# Patient Record
Sex: Male | Born: 1955 | Hispanic: Yes | Marital: Single | Smoking: Never smoker
Health system: Southern US, Community
[De-identification: ages and names within clinical notes are randomized; demographics above are authoritative.]

## PROBLEM LIST (undated history)

## (undated) DIAGNOSIS — I10 Essential (primary) hypertension: Secondary | ICD-10-CM

---

## 2017-12-04 ENCOUNTER — Encounter (HOSPITAL_COMMUNITY): Payer: Self-pay | Admitting: *Deleted

## 2017-12-04 ENCOUNTER — Emergency Department (HOSPITAL_COMMUNITY): Payer: Self-pay

## 2017-12-04 ENCOUNTER — Emergency Department (HOSPITAL_COMMUNITY)
Admission: EM | Admit: 2017-12-04 | Discharge: 2017-12-04 | Disposition: A | Discharge status: Home / Self Care | Payer: Self-pay | Attending: Emergency Medicine | Admitting: Emergency Medicine

## 2017-12-04 ENCOUNTER — Other Ambulatory Visit: Payer: Self-pay

## 2017-12-04 DIAGNOSIS — N390 Urinary tract infection, site not specified: Secondary | ICD-10-CM | POA: Insufficient documentation

## 2017-12-04 DIAGNOSIS — K5900 Constipation, unspecified: Secondary | ICD-10-CM | POA: Insufficient documentation

## 2017-12-04 DIAGNOSIS — R1012 Left upper quadrant pain: Secondary | ICD-10-CM | POA: Insufficient documentation

## 2017-12-04 LAB — COMPREHENSIVE METABOLIC PANEL
ALBUMIN: 4.2 g/dL (ref 3.5–5.0)
ALT: 44 U/L (ref 0–44)
AST: 33 U/L (ref 15–41)
Alkaline Phosphatase: 119 U/L (ref 38–126)
Anion gap: 11 (ref 5–15)
BILIRUBIN TOTAL: 0.4 mg/dL (ref 0.3–1.2)
BUN: 26 mg/dL — AB (ref 8–23)
CHLORIDE: 107 mmol/L (ref 98–111)
CO2: 25 mmol/L (ref 22–32)
CREATININE: 1.1 mg/dL (ref 0.61–1.24)
Calcium: 9.5 mg/dL (ref 8.9–10.3)
GFR calc Af Amer: >60 mL/min (ref >60)
GFR calc non Af Amer: >60 mL/min (ref >60)
Glucose, Bld: 121 mg/dL — ABNORMAL HIGH (ref 70–99)
Potassium: 3.8 mmol/L (ref 3.5–5.1)
Sodium: 143 mmol/L (ref 135–145)
Total Protein: 7.6 g/dL (ref 6.5–8.1)

## 2017-12-04 LAB — TROPONIN I

## 2017-12-04 LAB — CBC WITH DIFFERENTIAL/PLATELET
BASOS PCT: 1 %
Basophils Absolute: 0.1 10*3/uL (ref 0.0–0.1)
Eosinophils Absolute: 0.3 10*3/uL (ref 0.0–0.5)
Eosinophils Relative: 3 %
HEMATOCRIT: 39.1 % (ref 39.0–52.0)
Hemoglobin: 13.5 g/dL (ref 13.0–17.0)
LYMPHS ABS: 2.2 10*3/uL (ref 0.7–4.0)
Lymphocytes Relative: 24 %
MCH: 29.2 pg (ref 26.0–34.0)
MCHC: 34.5 g/dL (ref 30.0–36.0)
MCV: 84.6 fL (ref 80.0–100.0)
MONO ABS: 0.6 10*3/uL (ref 0.1–1.0)
MONOS PCT: 7 %
NEUTROS ABS: 6 10*3/uL (ref 1.7–7.7)
Neutrophils Relative %: 65 %
Platelets: 259 10*3/uL (ref 150–400)
RBC: 4.62 MIL/uL (ref 4.22–5.81)
RDW: 13.6 % (ref 11.5–15.5)
WBC: 9.2 10*3/uL (ref 4.0–10.5)

## 2017-12-04 LAB — URINALYSIS, ROUTINE W REFLEX MICROSCOPIC
Bilirubin Urine: NEGATIVE
Glucose, UA: NEGATIVE mg/dL
Hgb urine dipstick: NEGATIVE
Ketones, ur: NEGATIVE mg/dL
Nitrite: POSITIVE — AB
Protein, ur: NEGATIVE mg/dL
Specific Gravity, Urine: 1.019 (ref 1.005–1.030)
pH: 6 (ref 5.0–8.0)

## 2017-12-04 LAB — LIPASE, BLOOD: Lipase: 41 U/L (ref 11–51)

## 2017-12-04 MED ORDER — CEPHALEXIN 500 MG PO CAPS: 500.0000 mg | ORAL_CAPSULE | Freq: Two times a day (BID) | ORAL | 0 refills | Status: AC

## 2017-12-04 MED ORDER — MAGNESIUM CITRATE PO SOLN
1.0000 | Freq: Once | ORAL | Status: AC
  Administered 2017-12-04: 1 via ORAL
  Filled 2017-12-04: qty 296

## 2017-12-04 NOTE — ED Triage Notes (Signed)
Pt reports he has not had a bowel movement in two days. He is having upper abdominal pain and nausea for 2 days. He took advil for the pain.

## 2017-12-04 NOTE — Discharge Instructions (Addendum)
Your lab work and other tests today came back normal.   Your abdominal pain is from your constipation. It is unclear why you have been constipated. There are several over-the-counter stool softeners and laxatives that you can use for relief. Laxatives help you have bowel movements immediately vs stool softeners which help the stool pass easier. It is safe to take stool softeners daily.   Your urine revealed that you have an urinary tract infection. This could also be contributing to your abdominal issues today. I have sent a prescription for antibiotics that you need to take. It is important that you take the full five (5) day course of the antibiotics, Keflex, to make sure the infection is treated completely.  It is VERY important that you re-establish care with a primary care provider. Given your family's history of colon cancer, it is also important that you follow-up with PCP so you can get a colonoscopy.  Thank you for allowing me to take care of you today!

## 2017-12-04 NOTE — ED Notes (Signed)
Gave pt cup ice water and urinal. Instructed to use call bell when can void.

## 2017-12-04 NOTE — ED Notes (Signed)
Pt aware urine sample needed, urinal at bedside.  

## 2017-12-04 NOTE — ED Provider Notes (Signed)
[facility]-EMERGENCY DEPT Provider Note  CSN: [id number] Arrival date & time: 12/04/17  4098  History   Chief Complaint Chief Complaint  Patient presents with  . Constipation    HPI Kurt Martin is a 62 y.o. male with no significant medical history who presented to the ED for constipation. He reports not having a bowel movement in 2 days. Denies any recent changes in diet or appetite. He has not tried any intervention for relief. He reports family history of colon cancer, but patient has not had a colonoscopy or other evaluation for this. Denies past GI issues. Associated symptom: abdominal pain.   The history is provided by the patient.  Abdominal Pain   This is a new problem. The current episode started 2 days ago. Episode frequency: intermittent. The problem has not changed since onset.The pain is associated with an unknown factor. The pain is located in the epigastric region (Has moved to RUQ and LUQ as well). The quality of the pain is dull and aching. The pain is at a severity of 4/10. The pain is mild. Associated symptoms include nausea and constipation. Pertinent negatives include fever, diarrhea, hematochezia, melena, vomiting, dysuria, frequency, hematuria, arthralgias and myalgias. The symptoms are aggravated by eating. The symptoms are relieved by NSAIDs and H2 blockers. Past workup comments: None. Past medical history comments: No GI, cardiac or surgical history.    History reviewed. No pertinent past medical history.  There are no active problems to display for this patient.    Home Medications    Prior to Admission medications   Not on File    Family History No family history on file.  Social History Social History   Tobacco Use  . Smoking status: Never Smoker  Substance Use Topics  . Alcohol use: Never    Frequency: Never  . Drug use: Never     Allergies   Patient has no known allergies.   Review of Systems Review of Systems    Constitutional: Negative for appetite change, chills, fatigue, fever and unexpected weight change.  HENT: Negative.   Eyes: Negative.   Respiratory: Positive for shortness of breath.        Upon arrival to the ED  Cardiovascular: Positive for chest pain.  Gastrointestinal: Positive for abdominal pain, constipation and nausea. Negative for diarrhea, hematochezia, melena and vomiting.  Genitourinary: Negative for dysuria, frequency and hematuria.  Musculoskeletal: Negative for arthralgias and myalgias.  Skin: Negative.   Neurological: Negative.   Hematological: Negative.    Physical Exam Updated Vital Signs BP (!) 166/102   Pulse 93   Temp 98.8 F (37.1 C) (Oral)   Resp 18   SpO2 99%   Physical Exam  Constitutional: He appears well-developed and well-nourished. He is cooperative. He does not appear ill. No distress.  HENT:  Mouth/Throat: Uvula is midline, oropharynx is clear and moist and mucous membranes are normal.  Cardiovascular: Normal rate, regular rhythm, normal heart sounds, intact distal pulses and normal pulses.  No murmur heard. Pulmonary/Chest: Effort normal and breath sounds normal.  Abdominal: Soft. Normal appearance and bowel sounds are normal. There is no tenderness. There is no rigidity and no guarding.  Musculoskeletal: Normal range of motion.  Neurological: He is alert. He has normal strength. No sensory deficit.  Skin: Skin is warm. Capillary refill takes less than 2 seconds. No rash noted.  Nursing note and vitals reviewed.  ED Treatments / Results  Labs (all labs ordered are listed, but only abnormal results  are displayed) Labs Reviewed  COMPREHENSIVE METABOLIC PANEL - Abnormal; Notable for the following components:      Result Value   Glucose, Bld 121 (*)    BUN 26 (*)    All other components within normal limits  CBC WITH DIFFERENTIAL/PLATELET  LIPASE, BLOOD  URINALYSIS, ROUTINE W REFLEX MICROSCOPIC  TROPONIN I    EKG EKG  Interpretation  Date/Time:  Tuesday December 04 2017 06:49:55 EDT Ventricular Rate:  84 PR Interval:    QRS Duration: 88 QT Interval:  386 QTC Calculation: 457 R Axis:   29 Text Interpretation:  Sinus rhythm LVH by voltage Borderline T abnormalities, inferior leads No old tracing to compare Confirmed by Dione Booze ([id number]) on 12/04/2017 6:52:36 AM Also confirmed by Dione Booze ([id number]), editor Barbette Hair [phone])  on 12/04/2017 7:10:54 AM   Radiology No results found.  Procedures Procedures (including critical care time)  Medications Ordered in ED Medications - No data to display   Initial Impression / Assessment and Plan / ED Course  Triage vital signs and the nursing notes have been reviewed.  Pertinent labs & imaging results that were available during care of the patient were reviewed and considered in medical decision making (see chart for details).  Patient presents to the ED with 2 day history of upper abdominal complaints with constipation. History is nonspecific and there are no red flags for abdominal pain in his history. Denies tobacco or EtOH use. Has family history of colon cancer which patient has not received a colonoscopy for evaluation, but he does not have any s/s to suggest malignancy. He also endorsed chest pain and feeling SOB before coming to the ED, but states that those symptoms have resolved as well. Given pt's age and lack of medical follow-up, will proceed with GI and cardiac work-up to further evaluate his complaints.  Clinical Course as of Dec 04 848  Tue Dec 04, 2017  9147 EKG showed NSR. No ST elevations/depressions or signs of acute ischemia or infarct. This is reassuring in combination with negative troponin which assists in evaluating and ruling out an acute cardiac process. CXR normal. Useful in ruling out cardiac or pulm etiology to complaints. Blood work is unremarkable which assists in ruling out infectious or metabolic etiology to complaints.  Combined with normal physical exam, acute intra-abdominal processes such as appendicitis, pancreatitis, peritonitis or perforation is ruled out as well. No indication to do abdominal imaging.   [GM]  0801 Elevated BP upon arrival and throughout visit. No s/s of end organ damage. No past documentation of HTN, but pt's last medical visit was 02/2014 and reports not going to medical provider since then. Advised to follow-up with PCP to re-establish care for primary medical concerns.   [GM]  0840 UA indicative of UTI. Will treat as outpatient with Keflex. Urine culture sent.   [GM]    Clinical Course User Index [GM] Michelle Vanhise, Sharyon Medicus, PA-C    Final Clinical Impressions(s) / ED Diagnoses  1. Constipation. Education on OTC and supportive treatment for relief given. Patient requested laxative prior to discharge. Magnesium citrate solution given. Advised to establish care with PCP to discuss colonoscopy and BP. 2. UTI. Rx for Keflex 500mg  BID x5 days prescribed. Urine culture sent and patient will be contact if change in therapy is needed.  Dispo: Home. After thorough clinical evaluation, this patient is determined to be medically stable and can be safely discharged with the previously mentioned treatment and/or outpatient follow-up/referral(s). At this time, there are  no other apparent medical conditions that require further screening, evaluation or treatment.   Final diagnoses:  Constipation, unspecified constipation type  Urinary tract infection without hematuria, site unspecified    ED Discharge Orders         Ordered    cephALEXin (KEFLEX) 500 MG capsule  2 times daily     12/04/17 0841            Daviyon Widmayer, [place], PA-C 12/04/17 0850    Dione Booze, MD 12/09/17 2231

## 2017-12-06 LAB — URINE CULTURE: Culture: >=100000 — AB

## 2017-12-07 ENCOUNTER — Telehealth: Payer: Self-pay | Admitting: *Deleted

## 2017-12-07 NOTE — Progress Notes (Signed)
ED Antimicrobial Stewardship Positive Culture Follow Up   Kurt Martin is an 62 y.o. male who presented to [facility] on 12/04/2017 with a chief complaint of constipation and abdominal pain. Denied urinary symptoms (dysuria, frequency, hematuria), and fever. WBC wnl and patient afebrile.   Chief Complaint  Patient presents with  . Constipation    Recent Results (from the past 720 hour(s))  Urine culture     Status: Abnormal   Collection Time: 12/04/17  8:16 AM  Result Value Ref Range Status   Specimen Description   Final    URINE, CLEAN CATCH Performed at [facility], [address], [place], [place] [address]    Special Requests   Final    NONE Performed at [facility], [address] [place], [place] [address]    Culture >=100,000 COLONIES/mL ESCHERICHIA COLI (A)  Final   Report Status 12/06/2017 FINAL  Final   Organism ID, Bacteria ESCHERICHIA COLI (A)  Final      Susceptibility   Escherichia coli - MIC*    AMPICILLIN 4 SENSITIVE Sensitive     CEFAZOLIN <=4 SENSITIVE Sensitive     CEFTRIAXONE <=1 SENSITIVE Sensitive     CIPROFLOXACIN <=0.25 SENSITIVE Sensitive     GENTAMICIN <=1 SENSITIVE Sensitive     IMIPENEM <=0.25 SENSITIVE Sensitive     NITROFURANTOIN <=16 SENSITIVE Sensitive     TRIMETH/SULFA <=20 SENSITIVE Sensitive     AMPICILLIN/SULBACTAM <=2 SENSITIVE Sensitive     PIP/TAZO <=4 SENSITIVE Sensitive     Extended ESBL NEGATIVE Sensitive     * >=100,000 COLONIES/mL ESCHERICHIA COLI   Based on urinalysis, patient was discharged home on cephalexin for UTI. However, since patient was asymptomatic, this should be considered asymptomatic bacteriuria.   After discussing with ED provider today, the recommendation to stop antibiotics was declined and the new plan is to continue the course of antibiotics.  ED Provider: Harlene Salts, PA-C  Lawerance Bach 12/07/2017, 9:45 AM Clinical Pharmacist Monday - Friday phone -   [phone] Saturday - Sunday phone - [phone]

## 2017-12-07 NOTE — Telephone Encounter (Signed)
Post ED Visit - Positive Culture Follow-up  Culture report reviewed by antimicrobial stewardship pharmacist:  []  Enzo Bi, Pharm.D. []  Celedonio Miyamoto, Pharm.D., BCPS AQ-ID []  Garvin Fila, Pharm.D., BCPS []  Georgina Pillion, Pharm.D., BCPS []  [place], [address].D., BCPS, AAHIVP []  Estella Husk, Pharm.D., BCPS, AAHIVP []  Lysle Pearl, PharmD, BCPS []  Phillips Climes, PharmD, BCPS []  Agapito Games, PharmD, BCPS []  Verlan Friends, PharmD Danae Orleans, PharmD  Positive urine culture Treated with Cephalexin, organism sensitive to the same and no further patient follow-up is required at this time.  Virl Axe [facility] 12/07/2017, 11:50 AM

## 2018-01-03 ENCOUNTER — Ambulatory Visit (INDEPENDENT_AMBULATORY_CARE_PROVIDER_SITE_OTHER): Payer: Self-pay | Admitting: Physician Assistant

## 2018-01-03 ENCOUNTER — Encounter (INDEPENDENT_AMBULATORY_CARE_PROVIDER_SITE_OTHER): Payer: Self-pay | Admitting: Physician Assistant

## 2018-01-03 VITALS — BP 167/91 | HR 86 | Temp 97.9°F | Resp 18 | Ht 64.0 in | Wt 152.0 lb

## 2018-01-03 DIAGNOSIS — F411 Generalized anxiety disorder: Secondary | ICD-10-CM

## 2018-01-03 DIAGNOSIS — G5601 Carpal tunnel syndrome, right upper limb: Secondary | ICD-10-CM

## 2018-01-03 DIAGNOSIS — Z23 Encounter for immunization: Secondary | ICD-10-CM

## 2018-01-03 DIAGNOSIS — Z114 Encounter for screening for human immunodeficiency virus [HIV]: Secondary | ICD-10-CM

## 2018-01-03 DIAGNOSIS — R12 Heartburn: Secondary | ICD-10-CM

## 2018-01-03 DIAGNOSIS — I10 Essential (primary) hypertension: Secondary | ICD-10-CM

## 2018-01-03 DIAGNOSIS — Z1211 Encounter for screening for malignant neoplasm of colon: Secondary | ICD-10-CM

## 2018-01-03 DIAGNOSIS — Z1159 Encounter for screening for other viral diseases: Secondary | ICD-10-CM

## 2018-01-03 MED ORDER — HYDROCHLOROTHIAZIDE 25 MG PO TABS: 25.0000 mg | ORAL_TABLET | Freq: Every day | ORAL | 1 refills | Status: AC

## 2018-01-03 MED ORDER — ESCITALOPRAM OXALATE 10 MG PO TABS: 10.0000 mg | ORAL_TABLET | Freq: Every day | ORAL | 2 refills | Status: AC

## 2018-01-03 MED ORDER — HYDROXYZINE HCL 25 MG PO TABS: 25.0000 mg | ORAL_TABLET | Freq: Three times a day (TID) | ORAL | 0 refills | Status: AC | PRN

## 2018-01-03 MED ORDER — OMEPRAZOLE 40 MG PO CPDR: 40.0000 mg | DELAYED_RELEASE_CAPSULE | Freq: Every day | ORAL | 3 refills | Status: AC

## 2018-01-03 MED ORDER — LOSARTAN POTASSIUM 50 MG PO TABS: 50.0000 mg | ORAL_TABLET | Freq: Every day | ORAL | 3 refills | Status: AC

## 2018-01-03 NOTE — Patient Instructions (Signed)

## 2018-01-03 NOTE — Progress Notes (Signed)
Subjective:  Patient ID: Kurt Martin, male    DOB: Dec 29, 1955  Age: 62 y.o. MRN: [id number]  CC: hospital discharge f/u  HPI Kurt Martin is a 62 y.o. male with no significant medical history presents as a new patient on hospital discharge follow up.  Went to ED nearly one month ago with complaint of constipation. Had been two days without a bowel movement. Was prescribed Magnesium Citrate which helped clear his constipation. He was also diagnosed with a UTI. Culture grew E coli sensitive to all tested antibiotics. Pt prescribed Keflex but took only 5 days because he was told to take only five days. Does not endorse any urinary complaints. Only complaints are of heartburn and of right hand tingling. Pt requests antacid which has been helpful in resolving heartburn. 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    BP was noted to be elevated at the ED. Pt was told to inquire about HTN management with PCP. Pt states he has had elevated blood pressures for as long as he can remember. Attributes elevated BP to his "hyperactivity". Pt also endorses being a nervous person. Does not endorse any cardiovascular symptoms.      Outpatient Medications Prior to Visit  Medication Sig Dispense Refill  . ibuprofen (ADVIL,MOTRIN) 200 MG tablet Take 200 mg by mouth daily as needed for mild pain.     No facility-administered medications prior to visit.      ROS Review of Systems  Constitutional: Negative for chills, fever and malaise/fatigue.  Eyes: Negative for blurred vision.  Respiratory: Negative for shortness of breath.   Cardiovascular: Negative for chest pain and palpitations.  Gastrointestinal: Negative for abdominal pain and nausea.  Genitourinary: Negative for dysuria and hematuria.  Musculoskeletal: Negative for joint pain and myalgias.  Skin: Negative for rash.  Neurological: Negative for tingling and headaches.  Psychiatric/Behavioral: Negative  for depression. The patient is nervous/anxious.     Objective:   Vitals:   01/03/18 0917  BP: (!) 167/91  Pulse: 86  Resp: 18  Temp: 97.9 F (36.6 C)  TempSrc: Oral  SpO2: 98%  Weight: 152 lb (68.9 kg)  Height: 5\' 4"  (1.626 m)      Physical Exam  Constitutional: He is oriented to person, place, and time.  Well developed, well nourished, NAD, polite  HENT:  Head: Normocephalic and atraumatic.  Eyes: No scleral icterus.  Neck: Normal range of motion. Neck supple. No thyromegaly present.  Cardiovascular: Normal rate, regular rhythm and normal heart sounds.  Pulmonary/Chest: Effort normal and breath sounds normal.  Musculoskeletal: He exhibits no edema.  Neurological: He is alert and oriented to person, place, and time.  Skin: Skin is warm and dry. No rash noted. No erythema. No pallor.  Sweaty and clammy palms  Psychiatric: His behavior is normal. Thought content normal.  Seems anxious, fidgety  Vitals reviewed.    Assessment & Plan:   1. Hypertension, unspecified type - TSH - Begin hydrochlorothiazide (HYDRODIURIL) 25 MG tablet; Take 1 tablet (25 mg total) by mouth daily. Take on tablet in the morning.  Dispense: 90 tablet; Refill: 1 - Begin losartan (COZAAR) 50 MG tablet; Take 1 tablet (50 mg total) by mouth daily.  Dispense: 90 tablet; Refill: 3  2. Heartburn - Begin omeprazole (PRILOSEC) 40 MG capsule; Take 1 capsule (40 mg total) by mouth daily.  Dispense: 30 capsule; Refill: 3  3. Generalized anxiety disorder - TSH - escitalopram (LEXAPRO) 10 MG tablet; Take 1  tablet (10 mg total) by mouth daily.  Dispense: 30 tablet; Refill: 2 - hydrOXYzine (ATARAX/VISTARIL) 25 MG tablet; Take 1 tablet (25 mg total) by mouth 3 (three) times daily as needed.  Dispense: 30 tablet; Refill: 0  4. Carpal tunnel syndrome of right wrist - Pt advised to continue wearing his wrist splint and to avoid strong gripping or   5. Screening for HIV (human immunodeficiency virus) - HIV  Antibody (routine testing w rflx)  6. Screening for colon cancer - Fecal occult blood, imunochemical  7. Need for hepatitis C screening test - Hepatitis c antibody (reflex)  8. Need for Tdap vaccination - Tdap vaccine greater than or equal to 7yo IM   Meds ordered this encounter  Medications  . hydrochlorothiazide (HYDRODIURIL) 25 MG tablet    Sig: Take 1 tablet (25 mg total) by mouth daily. Take on tablet in the morning.    Dispense:  90 tablet    Refill:  1    Order Specific Question:   Supervising Provider    Answer:   Hoy Register [[id number]]  . losartan (COZAAR) 50 MG tablet    Sig: Take 1 tablet (50 mg total) by mouth daily.    Dispense:  90 tablet    Refill:  3    Order Specific Question:   Supervising Provider    Answer:   Hoy Register [[id number]]  . omeprazole (PRILOSEC) 40 MG capsule    Sig: Take 1 capsule (40 mg total) by mouth daily.    Dispense:  30 capsule    Refill:  3    Order Specific Question:   Supervising Provider    Answer:   Hoy Register [[id number]]  . escitalopram (LEXAPRO) 10 MG tablet    Sig: Take 1 tablet (10 mg total) by mouth daily.    Dispense:  30 tablet    Refill:  2    Order Specific Question:   Supervising Provider    Answer:   Hoy Register [[id number]]  . hydrOXYzine (ATARAX/VISTARIL) 25 MG tablet    Sig: Take 1 tablet (25 mg total) by mouth 3 (three) times daily as needed.    Dispense:  30 tablet    Refill:  0    Order Specific Question:   Supervising Provider    Answer:   Hoy Register [[id number]]    Follow-up: Return in about 4 weeks (around 01/31/2018) for anxiety.   Loletta Specter PA

## 2018-01-04 ENCOUNTER — Telehealth (INDEPENDENT_AMBULATORY_CARE_PROVIDER_SITE_OTHER): Payer: Self-pay

## 2018-01-04 LAB — HEPATITIS C ANTIBODY (REFLEX)

## 2018-01-04 LAB — HIV ANTIBODY (ROUTINE TESTING W REFLEX): HIV Screen 4th Generation wRfx: NONREACTIVE

## 2018-01-04 LAB — TSH: TSH: 2.7 u[IU]/mL (ref 0.450–4.500)

## 2018-01-04 LAB — HCV COMMENT:

## 2018-01-04 NOTE — Telephone Encounter (Signed)
-----   Message from Roger David Gomez, PA-C sent at 01/04/2018 12:48 PM EST ----- Normal thyroid. Negative HIV and HCV. 

## 2018-01-04 NOTE — Telephone Encounter (Addendum)
Call placed to patient using pacific interpreter Doree Fudge [phone]) patient voicemail not setup yet. Will attempt to call patient once more. If patient returns all please inform that normal thyroid; negative HIV and Hep C. Maryjean Morn, CMA

## 2018-01-07 ENCOUNTER — Encounter (INDEPENDENT_AMBULATORY_CARE_PROVIDER_SITE_OTHER): Payer: Self-pay

## 2018-01-07 ENCOUNTER — Telehealth (INDEPENDENT_AMBULATORY_CARE_PROVIDER_SITE_OTHER): Payer: Self-pay

## 2018-01-07 NOTE — Telephone Encounter (Signed)
Call placed using pacific interpreter [phone]) patient voicemail not setup yet. Results mailed to patient. Maryjean Morn, CMA

## 2018-01-07 NOTE — Telephone Encounter (Signed)
-----   Message from Loletta Specter, PA-C sent at 01/04/2018 12:48 PM EST ----- Normal thyroid. Negative HIV and HCV.

## 2018-01-31 ENCOUNTER — Other Ambulatory Visit: Payer: Self-pay

## 2018-01-31 ENCOUNTER — Ambulatory Visit (INDEPENDENT_AMBULATORY_CARE_PROVIDER_SITE_OTHER): Payer: Self-pay | Admitting: Physician Assistant

## 2018-01-31 ENCOUNTER — Encounter (INDEPENDENT_AMBULATORY_CARE_PROVIDER_SITE_OTHER): Payer: Self-pay | Admitting: Physician Assistant

## 2018-01-31 VITALS — BP 155/92 | HR 73 | Temp 98.1°F | Ht 64.0 in | Wt 151.4 lb

## 2018-01-31 DIAGNOSIS — I10 Essential (primary) hypertension: Secondary | ICD-10-CM

## 2018-01-31 DIAGNOSIS — R12 Heartburn: Secondary | ICD-10-CM

## 2018-01-31 DIAGNOSIS — F411 Generalized anxiety disorder: Secondary | ICD-10-CM

## 2018-01-31 DIAGNOSIS — Z131 Encounter for screening for diabetes mellitus: Secondary | ICD-10-CM

## 2018-01-31 LAB — POCT GLYCOSYLATED HEMOGLOBIN (HGB A1C): Hemoglobin A1C: 5.5 % (ref 4.0–5.6)

## 2018-01-31 NOTE — Progress Notes (Signed)
Subjective:  Patient ID: Kurt Martin, male    DOB: 11-28-1955  Age: 63 y.o. MRN: [id number]  CC:   HPI Kurt Martin is a 62 y.o. male with a PMH of HTN presents to f/u on HTN. Last seen here one month ago with a BP of 167/91 mmHg. Prescribed HCTZ 25 mg and Losartan 50 mg. TSH normal. BP 155/92 mmHg in clinic today. Says he has not taken his anti-hypertensive medications yet this morning since he has not eaten. Also states he is not taking one of his other  medications since it caused diarrhea. Does not know the name of the medication. Reports resolution of heartburn with omeprazole. In regards to anxiety, last GAD7 score was one but patient seemed clinically anxious and was prescribed Lexapro. It is unclear if he is taking Lexapro but GAD7 score is zero today. PHQ9 score was 1 and now 0.       Outpatient Medications Prior to Visit  Medication Sig Dispense Refill  . escitalopram (LEXAPRO) 10 MG tablet Take 1 tablet (10 mg total) by mouth daily. 30 tablet 2  . hydrochlorothiazide (HYDRODIURIL) 25 MG tablet Take 1 tablet (25 mg total) by mouth daily. Take on tablet in the morning. 90 tablet 1  . hydrOXYzine (ATARAX/VISTARIL) 25 MG tablet Take 1 tablet (25 mg total) by mouth 3 (three) times daily as needed. 30 tablet 0  . losartan (COZAAR) 50 MG tablet Take 1 tablet (50 mg total) by mouth daily. 90 tablet 3  . omeprazole (PRILOSEC) 40 MG capsule Take 1 capsule (40 mg total) by mouth daily. 30 capsule 3   No facility-administered medications prior to visit.      ROS Review of Systems  Constitutional: Negative for chills, fever and malaise/fatigue.  Eyes: Negative for blurred vision.  Respiratory: Negative for shortness of breath.   Cardiovascular: Negative for chest pain and palpitations.  Gastrointestinal: Negative for abdominal pain and nausea.  Genitourinary: Negative for dysuria and hematuria.  Musculoskeletal: Negative for joint pain and myalgias.  Skin: Negative for rash.   Neurological: Negative for tingling and headaches.  Psychiatric/Behavioral: Negative for depression. The patient is not nervous/anxious.     Objective:  BP (!) 155/92 (BP Location: Right Arm, Patient Position: Sitting, Cuff Size: Normal)   Pulse 73   Temp 98.1 F (36.7 C) (Oral)   Ht 5\' 4"  (1.626 m)   Wt 151 lb 6.4 oz (68.7 kg)   SpO2 97%   BMI 25.99 kg/m   BP/Weight 01/31/2018 01/03/2018 12/04/2017  Systolic BP 155 167 178  Diastolic BP 92 91 100  Wt. (Lbs) 151.4 152 -  BMI 25.99 26.09 -      Physical Exam  Constitutional: He is oriented to person, place, and time.  Well developed, well nourished, NAD, polite  HENT:  Head: Normocephalic and atraumatic.  Eyes: No scleral icterus.  Neck: Normal range of motion. Neck supple. No thyromegaly present.  Cardiovascular: Normal rate, regular rhythm and normal heart sounds.  Pulmonary/Chest: Effort normal and breath sounds normal.  Abdominal: Soft. Bowel sounds are normal. There is no tenderness.  Musculoskeletal: He exhibits no edema.  Neurological: He is alert and oriented to person, place, and time.  Skin: Skin is warm and dry. No rash noted. No erythema. No pallor.  Psychiatric: He has a normal mood and affect. His behavior is normal. Thought content normal.  Somewhat hyperactive in mannerisms and speech  Vitals reviewed.    Assessment & Plan:   1. Hypertension, unspecified type -  Better today but still elevated. Has not taken his anti-hypertensive medications yet this morning. Pt advised to take medications before he sees his provider again.   2. Screening for diabetes mellitus - HgB A1c 5.5% today.  3. Generalized anxiety disorder - Unclear if he is taking Lexapro. Pt does not remember names of medications. Says he does not consider himself an anxious person. Pt advised to take Lexapro 10 mg as directed.  4. Heartburn - Resolved with omeprazole  Follow-up: Return in about 6 weeks (around 03/14/2018) for BP check .    Loletta Specteroger David Mcdonald Reiling PA

## 2018-01-31 NOTE — Patient Instructions (Signed)
DASH Eating Plan DASH stands for "Dietary Approaches to Stop Hypertension." The DASH eating plan is a healthy eating plan that has been shown to reduce high blood pressure (hypertension). It may also reduce your risk for type 2 diabetes, heart disease, and stroke. The DASH eating plan may also help with weight loss. What are tips for following this plan? General guidelines  Avoid eating more than 2,300 mg (milligrams) of salt (sodium) a day. If you have hypertension, you may need to reduce your sodium intake to 1,500 mg a day.  Limit alcohol intake to no more than 1 drink a day for nonpregnant women and 2 drinks a day for men. One drink equals 12 oz of beer, 5 oz of wine, or 1 oz of hard liquor.  Work with your health care provider to maintain a healthy body weight or to lose weight. Ask what an ideal weight is for you.  Get at least 30 minutes of exercise that causes your heart to beat faster (aerobic exercise) most days of the week. Activities may include walking, swimming, or biking.  Work with your health care provider or diet and nutrition specialist (dietitian) to adjust your eating plan to your individual calorie needs. Reading food labels  Check food labels for the amount of sodium per serving. Choose foods with less than 5 percent of the Daily Value of sodium. Generally, foods with less than 300 mg of sodium per serving fit into this eating plan.  To find whole grains, look for the word "whole" as the first word in the ingredient list. Shopping  Buy products labeled as "low-sodium" or "no salt added."  Buy fresh foods. Avoid canned foods and premade or frozen meals. Cooking  Avoid adding salt when cooking. Use salt-free seasonings or herbs instead of table salt or sea salt. Check with your health care provider or pharmacist before using salt substitutes.  Do not fry foods. Cook foods using healthy methods such as baking, boiling, grilling, and broiling instead.  Cook with  heart-healthy oils, such as olive, canola, soybean, or sunflower oil. Meal planning   Eat a balanced diet that includes: ? 5 or more servings of fruits and vegetables each day. At each meal, try to fill half of your plate with fruits and vegetables. ? Up to 6-8 servings of whole grains each day. ? Less than 6 oz of lean meat, poultry, or fish each day. A 3-oz serving of meat is about the same size as a deck of cards. One egg equals 1 oz. ? 2 servings of low-fat dairy each day. ? A serving of nuts, seeds, or beans 5 times each week. ? Heart-healthy fats. Healthy fats called Omega-3 fatty acids are found in foods such as flaxseeds and coldwater fish, like sardines, salmon, and mackerel.  Limit how much you eat of the following: ? Canned or prepackaged foods. ? Food that is high in trans fat, such as fried foods. ? Food that is high in saturated fat, such as fatty meat. ? Sweets, desserts, sugary drinks, and other foods with added sugar. ? Full-fat dairy products.  Do not salt foods before eating.  Try to eat at least 2 vegetarian meals each week.  Eat more home-cooked food and less restaurant, buffet, and fast food.  When eating at a restaurant, ask that your food be prepared with less salt or no salt, if possible. What foods are recommended? The items listed may not be a complete list. Talk with your dietitian about what   dietary choices are best for you. Grains Whole-grain or whole-wheat bread. Whole-grain or whole-wheat pasta. Brown rice. Oatmeal. Quinoa. Bulgur. Whole-grain and low-sodium cereals. Pita bread. Low-fat, low-sodium crackers. Whole-wheat flour tortillas. Vegetables Fresh or frozen vegetables (raw, steamed, roasted, or grilled). Low-sodium or reduced-sodium tomato and vegetable juice. Low-sodium or reduced-sodium tomato sauce and tomato paste. Low-sodium or reduced-sodium canned vegetables. Fruits All fresh, dried, or frozen fruit. Canned fruit in natural juice (without  added sugar). Meat and other protein foods Skinless chicken or turkey. Ground chicken or turkey. Pork with fat trimmed off. Fish and seafood. Egg whites. Dried beans, peas, or lentils. Unsalted nuts, nut butters, and seeds. Unsalted canned beans. Lean cuts of beef with fat trimmed off. Low-sodium, lean deli meat. Dairy Low-fat (1%) or fat-free (skim) milk. Fat-free, low-fat, or reduced-fat cheeses. Nonfat, low-sodium ricotta or cottage cheese. Low-fat or nonfat yogurt. Low-fat, low-sodium cheese. Fats and oils Soft margarine without trans fats. Vegetable oil. Low-fat, reduced-fat, or light mayonnaise and salad dressings (reduced-sodium). Canola, safflower, olive, soybean, and sunflower oils. Avocado. Seasoning and other foods Herbs. Spices. Seasoning mixes without salt. Unsalted popcorn and pretzels. Fat-free sweets. What foods are not recommended? The items listed may not be a complete list. Talk with your dietitian about what dietary choices are best for you. Grains Baked goods made with fat, such as croissants, muffins, or some breads. Dry pasta or rice meal packs. Vegetables Creamed or fried vegetables. Vegetables in a cheese sauce. Regular canned vegetables (not low-sodium or reduced-sodium). Regular canned tomato sauce and paste (not low-sodium or reduced-sodium). Regular tomato and vegetable juice (not low-sodium or reduced-sodium). Pickles. Olives. Fruits Canned fruit in a light or heavy syrup. Fried fruit. Fruit in cream or butter sauce. Meat and other protein foods Fatty cuts of meat. Ribs. Fried meat. Bacon. Sausage. Bologna and other processed lunch meats. Salami. Fatback. Hotdogs. Bratwurst. Salted nuts and seeds. Canned beans with added salt. Canned or smoked fish. Whole eggs or egg yolks. Chicken or turkey with skin. Dairy Whole or 2% milk, cream, and half-and-half. Whole or full-fat cream cheese. Whole-fat or sweetened yogurt. Full-fat cheese. Nondairy creamers. Whipped toppings.  Processed cheese and cheese spreads. Fats and oils Butter. Stick margarine. Lard. Shortening. Ghee. Bacon fat. Tropical oils, such as coconut, palm kernel, or palm oil. Seasoning and other foods Salted popcorn and pretzels. Onion salt, garlic salt, seasoned salt, table salt, and sea salt. Worcestershire sauce. Tartar sauce. Barbecue sauce. Teriyaki sauce. Soy sauce, including reduced-sodium. Steak sauce. Canned and packaged gravies. Fish sauce. Oyster sauce. Cocktail sauce. Horseradish that you find on the shelf. Ketchup. Mustard. Meat flavorings and tenderizers. Bouillon cubes. Hot sauce and Tabasco sauce. Premade or packaged marinades. Premade or packaged taco seasonings. Relishes. Regular salad dressings. Where to find more information:  National Heart, Lung, and Blood Institute: www.nhlbi.nih.gov  American Heart Association: www.heart.org Summary  The DASH eating plan is a healthy eating plan that has been shown to reduce high blood pressure (hypertension). It may also reduce your risk for type 2 diabetes, heart disease, and stroke.  With the DASH eating plan, you should limit salt (sodium) intake to 2,300 mg a day. If you have hypertension, you may need to reduce your sodium intake to 1,500 mg a day.  When on the DASH eating plan, aim to eat more fresh fruits and vegetables, whole grains, lean proteins, low-fat dairy, and heart-healthy fats.  Work with your health care provider or diet and nutrition specialist (dietitian) to adjust your eating plan to your individual   calorie needs. This information is not intended to replace advice given to you by your health care provider. Make sure you discuss any questions you have with your health care provider. Document Released: 02/02/2011 Document Revised: 02/07/2016 Document Reviewed: 02/07/2016 Elsevier Interactive Patient Education  2018 [employer]  

## 2018-03-14 ENCOUNTER — Ambulatory Visit (INDEPENDENT_AMBULATORY_CARE_PROVIDER_SITE_OTHER): Payer: Self-pay

## 2018-08-14 ENCOUNTER — Other Ambulatory Visit: Payer: Self-pay

## 2018-08-14 ENCOUNTER — Emergency Department (HOSPITAL_COMMUNITY)
Admission: EM | Admit: 2018-08-14 | Discharge: 2018-08-14 | Disposition: A | Discharge status: Home / Self Care | Payer: Self-pay | Attending: Emergency Medicine | Admitting: Emergency Medicine

## 2018-08-14 ENCOUNTER — Emergency Department (HOSPITAL_COMMUNITY): Payer: Self-pay

## 2018-08-14 ENCOUNTER — Encounter (HOSPITAL_COMMUNITY): Payer: Self-pay

## 2018-08-14 DIAGNOSIS — I10 Essential (primary) hypertension: Secondary | ICD-10-CM | POA: Insufficient documentation

## 2018-08-14 DIAGNOSIS — Z79899 Other long term (current) drug therapy: Secondary | ICD-10-CM | POA: Insufficient documentation

## 2018-08-14 DIAGNOSIS — R197 Diarrhea, unspecified: Secondary | ICD-10-CM | POA: Insufficient documentation

## 2018-08-14 DIAGNOSIS — R1012 Left upper quadrant pain: Secondary | ICD-10-CM | POA: Insufficient documentation

## 2018-08-14 DIAGNOSIS — R1011 Right upper quadrant pain: Secondary | ICD-10-CM

## 2018-08-14 DIAGNOSIS — R112 Nausea with vomiting, unspecified: Secondary | ICD-10-CM | POA: Insufficient documentation

## 2018-08-14 HISTORY — DX: Essential (primary) hypertension: I10

## 2018-08-14 LAB — URINALYSIS, ROUTINE W REFLEX MICROSCOPIC
Bilirubin Urine: NEGATIVE
Glucose, UA: NEGATIVE mg/dL
Hgb urine dipstick: NEGATIVE
Ketones, ur: NEGATIVE mg/dL
Leukocytes,Ua: NEGATIVE
Nitrite: NEGATIVE
Protein, ur: NEGATIVE mg/dL
Specific Gravity, Urine: 1.023 (ref 1.005–1.030)
pH: 5 (ref 5.0–8.0)

## 2018-08-14 LAB — COMPREHENSIVE METABOLIC PANEL
ALT: 26 U/L (ref 0–44)
AST: 27 U/L (ref 15–41)
Albumin: 4.7 g/dL (ref 3.5–5.0)
Alkaline Phosphatase: 96 U/L (ref 38–126)
Anion gap: 11 (ref 5–15)
BUN: 13 mg/dL (ref 8–23)
CO2: 23 mmol/L (ref 22–32)
Calcium: 8.6 mg/dL — ABNORMAL LOW (ref 8.9–10.3)
Chloride: 107 mmol/L (ref 98–111)
Creatinine, Ser: 1.02 mg/dL (ref 0.61–1.24)
GFR calc Af Amer: >60 mL/min (ref >60)
GFR calc non Af Amer: >60 mL/min (ref >60)
Glucose, Bld: 129 mg/dL — ABNORMAL HIGH (ref 70–99)
Potassium: 4 mmol/L (ref 3.5–5.1)
Sodium: 141 mmol/L (ref 135–145)
Total Bilirubin: 1.1 mg/dL (ref 0.3–1.2)
Total Protein: 8 g/dL (ref 6.5–8.1)

## 2018-08-14 LAB — CBC WITH DIFFERENTIAL/PLATELET
Abs Immature Granulocytes: 0.06 10*3/uL (ref 0.00–0.07)
Basophils Absolute: 0.1 10*3/uL (ref 0.0–0.1)
Basophils Relative: 1 %
Eosinophils Absolute: 0.1 10*3/uL (ref 0.0–0.5)
Eosinophils Relative: 1 %
HCT: 40.4 % (ref 39.0–52.0)
Hemoglobin: 13.4 g/dL (ref 13.0–17.0)
Immature Granulocytes: 1 %
Lymphocytes Relative: 21 %
Lymphs Abs: 1.9 10*3/uL (ref 0.7–4.0)
MCH: 28.3 pg (ref 26.0–34.0)
MCHC: 33.2 g/dL (ref 30.0–36.0)
MCV: 85.2 fL (ref 80.0–100.0)
Monocytes Absolute: 0.6 10*3/uL (ref 0.1–1.0)
Monocytes Relative: 6 %
Neutro Abs: 6.5 10*3/uL (ref 1.7–7.7)
Neutrophils Relative %: 70 %
Platelets: 240 10*3/uL (ref 150–400)
RBC: 4.74 MIL/uL (ref 4.22–5.81)
RDW: 13.2 % (ref 11.5–15.5)
WBC: 9.2 10*3/uL (ref 4.0–10.5)
nRBC: 0 % (ref 0.0–0.2)

## 2018-08-14 LAB — TROPONIN I: Troponin I: <0.03 ng/mL (ref <0.03)

## 2018-08-14 MED ORDER — IOHEXOL 300 MG/ML  SOLN
100.0000 mL | Freq: Once | INTRAMUSCULAR | Status: AC | PRN
  Administered 2018-08-14: 100 mL via INTRAVENOUS

## 2018-08-14 MED ORDER — FENTANYL CITRATE (PF) 100 MCG/2ML IJ SOLN
50.0000 ug | Freq: Once | INTRAMUSCULAR | Status: AC
  Administered 2018-08-14: 02:00:00 50 ug via INTRAVENOUS
  Filled 2018-08-14: qty 2

## 2018-08-14 MED ORDER — KETOROLAC TROMETHAMINE 30 MG/ML IJ SOLN
15.0000 mg | Freq: Once | INTRAMUSCULAR | Status: AC
  Administered 2018-08-14: 15 mg via INTRAVENOUS
  Filled 2018-08-14: qty 1

## 2018-08-14 MED ORDER — OMEPRAZOLE 20 MG PO CPDR: 20.0000 mg | DELAYED_RELEASE_CAPSULE | Freq: Every day | ORAL | 0 refills | Status: AC

## 2018-08-14 MED ORDER — HYDRALAZINE HCL 20 MG/ML IJ SOLN
2.0000 mg | Freq: Once | INTRAMUSCULAR | Status: AC
  Administered 2018-08-14: 2 mg via INTRAVENOUS
  Filled 2018-08-14: qty 1

## 2018-08-14 MED ORDER — ALUM & MAG HYDROXIDE-SIMETH 200-200-20 MG/5ML PO SUSP
30.0000 mL | Freq: Once | ORAL | Status: AC
  Administered 2018-08-14: 30 mL via ORAL
  Filled 2018-08-14: qty 30

## 2018-08-14 MED ORDER — DICYCLOMINE HCL 20 MG PO TABS: 20.0000 mg | ORAL_TABLET | Freq: Two times a day (BID) | ORAL | 0 refills | Status: AC

## 2018-08-14 MED ORDER — SODIUM CHLORIDE (PF) 0.9 % IJ SOLN
INTRAMUSCULAR | Status: AC
  Filled 2018-08-14: qty 50

## 2018-08-14 MED ORDER — LIDOCAINE VISCOUS HCL 2 % MT SOLN
15.0000 mL | Freq: Once | OROMUCOSAL | Status: AC
  Administered 2018-08-14: 01:00:00 15 mL via ORAL
  Filled 2018-08-14: qty 15

## 2018-08-14 MED ORDER — ONDANSETRON HCL 4 MG/2ML IJ SOLN
4.0000 mg | Freq: Once | INTRAMUSCULAR | Status: DC
  Filled 2018-08-14: qty 2

## 2018-08-14 MED ORDER — ONDANSETRON 8 MG PO TBDP: ORAL_TABLET | ORAL | 0 refills | Status: AC

## 2018-08-14 MED ORDER — DICYCLOMINE HCL 10 MG/ML IM SOLN
20.0000 mg | Freq: Once | INTRAMUSCULAR | Status: AC
  Administered 2018-08-14: 20 mg via INTRAMUSCULAR
  Filled 2018-08-14: qty 2

## 2018-08-14 NOTE — ED Notes (Signed)
Pt verbalized discharge instructions and follow up care. Alert and ambulatory. No IV.  

## 2018-08-14 NOTE — ED Notes (Signed)
Patient transported to CT 

## 2018-08-14 NOTE — ED Notes (Signed)
Urine and culture sent to lab  

## 2018-08-14 NOTE — ED Triage Notes (Signed)
Pt arrives with complaints of LUQ abdominal pain over the last three days. Pt has vomited 3 times in the last 24 hours. Pt reports some diarrhea but not today.

## 2018-08-14 NOTE — ED Provider Notes (Signed)
[facility]-EMERGENCY DEPT Provider Note   CSN: [id number] Arrival date & time: 08/14/18  0046     History   Chief Complaint Chief Complaint  Patient presents with  . Abdominal Pain    HPI Kurt Martin is a 63 y.o. male.     The history is provided by the patient.  Abdominal Pain Pain location:  LUQ and L flank Pain quality: cramping   Pain radiates to:  Chest Pain severity:  Severe Onset quality:  Gradual Duration:  2 days Timing:  Constant Progression:  Unchanged Chronicity:  Recurrent Context: not diet changes, not eating, not medication withdrawal, not sick contacts, not suspicious food intake and not trauma   Relieved by:  Nothing Worsened by:  Nothing Ineffective treatments:  NSAIDs Associated symptoms: diarrhea, nausea and vomiting   Associated symptoms: no cough, no dysuria, no fever and no shortness of breath   Risk factors: no alcohol abuse   Patient with GERD and HTN not on meds presents with left sided abdominal pain.  He has had this off and on in the past but usually resolves on its own but not this time and he had nausea and vomiting and loose stool this past am.  No f/c/r.  No cough, no SOB.  No exertional symptoms.    Past Medical History:  Diagnosis Date  . Hypertension     There are no active problems to display for this patient.   History reviewed. No pertinent surgical history.      Home Medications    Prior to Admission medications   Medication Sig Start Date End Date Taking? Authorizing Provider  hydrochlorothiazide (HYDRODIURIL) 25 MG tablet Take 1 tablet (25 mg total) by mouth daily. Take on tablet in the morning. 01/03/18  Yes Loletta SpecterGomez, Roger David, PA-C  losartan (COZAAR) 50 MG tablet Take 1 tablet (50 mg total) by mouth daily. 01/03/18  Yes Loletta SpecterGomez, Roger David, PA-C  omeprazole (PRILOSEC) 40 MG capsule Take 1 capsule (40 mg total) by mouth daily. 01/03/18  Yes Loletta SpecterGomez, Roger David, PA-C  escitalopram (LEXAPRO) 10 MG  tablet Take 1 tablet (10 mg total) by mouth daily. Patient not taking: Reported on 08/14/2018 01/03/18   Loletta SpecterGomez, Roger David, PA-C  hydrOXYzine (ATARAX/VISTARIL) 25 MG tablet Take 1 tablet (25 mg total) by mouth 3 (three) times daily as needed. Patient not taking: Reported on 08/14/2018 01/03/18   Loletta SpecterGomez, Roger David, PA-C    Family History No family history on file.  Social History Social History   Tobacco Use  . Smoking status: Never Smoker  . Smokeless tobacco: Never Used  Substance Use Topics  . Alcohol use: Never    Frequency: Never  . Drug use: Never     Allergies   Patient has no known allergies.   Review of Systems Review of Systems  Constitutional: Negative for fever.  Respiratory: Negative for cough and shortness of breath.   Cardiovascular: Negative for palpitations and leg swelling.  Gastrointestinal: Positive for abdominal pain, diarrhea, nausea and vomiting. Negative for blood in stool.  Genitourinary: Negative for difficulty urinating and dysuria.  All other systems reviewed and are negative.    Physical Exam Updated Vital Signs BP (!) 173/133 (BP Location: Left Arm)   Pulse 93   Temp 98.7 F (37.1 C) (Oral)   Resp 16   Ht 5\' 5"  (1.651 m)   SpO2 100%   BMI 25.19 kg/m   Physical Exam Vitals signs and nursing note reviewed.  Constitutional:  General: He is not in acute distress.    Appearance: He is normal weight.  HENT:     Head: Normocephalic and atraumatic.     Nose: Nose normal.     Mouth/Throat:     Mouth: Mucous membranes are moist.     Pharynx: Oropharynx is clear.  Eyes:     Conjunctiva/sclera: Conjunctivae normal.     Pupils: Pupils are equal, round, and reactive to light.  Neck:     Musculoskeletal: Normal range of motion and neck supple.  Cardiovascular:     Rate and Rhythm: Normal rate and regular rhythm.     Pulses: Normal pulses.     Heart sounds: Normal heart sounds.  Pulmonary:     Effort: Pulmonary effort is normal.      Breath sounds: Normal breath sounds.  Abdominal:     General: Abdomen is flat. Bowel sounds are normal.     Palpations: There is no mass.     Tenderness: There is no abdominal tenderness. There is no guarding or rebound.     Hernia: No hernia is present.  Musculoskeletal: Normal range of motion.  Skin:    General: Skin is warm and dry.     Capillary Refill: Capillary refill takes less than 2 seconds.  Neurological:     General: No focal deficit present.     Mental Status: He is alert and oriented to person, place, and time.  Psychiatric:        Mood and Affect: Mood normal.        Behavior: Behavior normal.      ED Treatments / Results  Labs (all labs ordered are listed, but only abnormal results are displayed) Labs Reviewed  CBC WITH DIFFERENTIAL/PLATELET  COMPREHENSIVE METABOLIC PANEL  TROPONIN I  URINALYSIS, ROUTINE W REFLEX MICROSCOPIC    EKG EKG Interpretation  Date/Time:  Wednesday August 14 2018 01:11:14 EDT Ventricular Rate:  76 PR Interval:    QRS Duration: 85 QT Interval:  415 QTC Calculation: 467 R Axis:   38 Text Interpretation:  Sinus rhythm Confirmed by Dory Horn) on 08/14/2018 1:17:17 AM   Radiology Dg Chest Portable 1 View  Result Date: 08/14/2018 CLINICAL DATA:  Chest pain. Nausea. EXAM: PORTABLE CHEST 1 VIEW COMPARISON:  12/04/2017 FINDINGS: Low lung volumes. Bronchial thickening again seen, likely not significantly changed. Normal heart size and mediastinal contours for low lung volumes. No confluent airspace disease, pleural effusion or pneumothorax. Degenerative change of the right shoulder. IMPRESSION: Low lung volumes. Bronchial thickening is unchanged from prior exam. Electronically Signed   By: Keith Rake M.D.   On: 08/14/2018 01:23    Procedures Procedures (including critical care time)  Medications Ordered in ED Medications  sodium chloride (PF) 0.9 % injection (has no administration in time range)  ondansetron (ZOFRAN)  injection 4 mg (4 mg Intravenous Not Given 08/14/18 0350)  alum & mag hydroxide-simeth (MAALOX/MYLANTA) 200-200-20 MG/5ML suspension 30 mL (30 mLs Oral Given 08/14/18 0119)    And  lidocaine (XYLOCAINE) 2 % viscous mouth solution 15 mL (15 mLs Oral Given 08/14/18 0119)  fentaNYL (SUBLIMAZE) injection 50 mcg (50 mcg Intravenous Given 08/14/18 0138)  hydrALAZINE (APRESOLINE) injection 2 mg (2 mg Intravenous Given 08/14/18 0140)  iohexol (OMNIPAQUE) 300 MG/ML solution 100 mL (100 mLs Intravenous Contrast Given 08/14/18 0332)  dicyclomine (BENTYL) injection 20 mg (20 mg Intramuscular Given 08/14/18 0410)  ketorolac (TORADOL) 30 MG/ML injection 15 mg (15 mg Intravenous Given 08/14/18 0409)     Final  Clinical Impressions(s) / ED Diagnoses   Return for intractable cough, coughing up blood,fevers >100.4 unrelieved by medication, shortness of breath, intractable vomiting, chest pain, shortness of breath, weakness,numbness, changes in speech, facial asymmetry,abdominal pain, passing out,Inability to tolerate liquids or food, cough, altered mental status or any concerns. No signs of systemic illness or infection. The patient is nontoxic-appearing on exam and vital signs are within normal limits.   I have reviewed the triage vital signs and the nursing notes. Pertinent labs &imaging results that were available during my care of the patient were reviewed by me and considered in my medical decision making (see chart for details).  After history, exam, and medical workup I feel the patient has been appropriately medically screened and is safe for discharge home. Pertinent diagnoses were discussed with the patient. Patient was given return precautions    Karn Derk, MD 08/14/18 [id number]

## 2020-07-24 IMAGING — CT CT ABDOMEN AND PELVIS WITH CONTRAST
2 of 5 series · 16 of 46 positions shown, 18 images · IV contrast (omnipaque)
Comparison: None.

CLINICAL DATA: 62 y/o M; 3 days of left upper quadrant abdominal
pain, nausea, vomiting, diarrhea.

EXAM:
CT ABDOMEN AND PELVIS WITH CONTRAST
TECHNIQUE: Multidetector CT imaging of the abdomen and pelvis was performed
using the standard protocol following bolus administration of
intravenous contrast.
CONTRAST:  100mL OMNIPAQUE IOHEXOL 300 MG/ML  SOLN

[Series 2: axial st · axial · 0.68mm/px · z∈[-246,+114]mm · 13 of 84 slices shown, 15 images]
[im 6/84  soft-tissue]
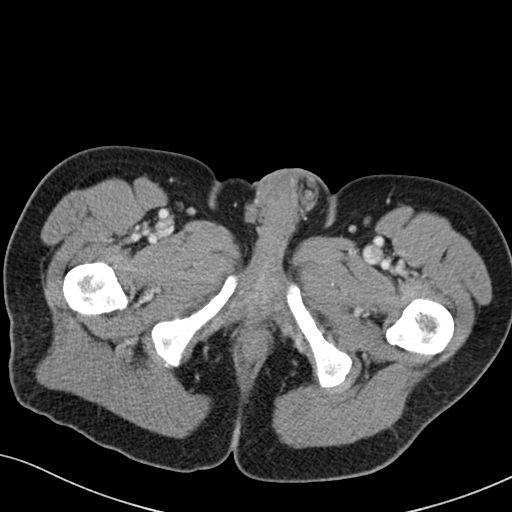
[im 6/84  bone]
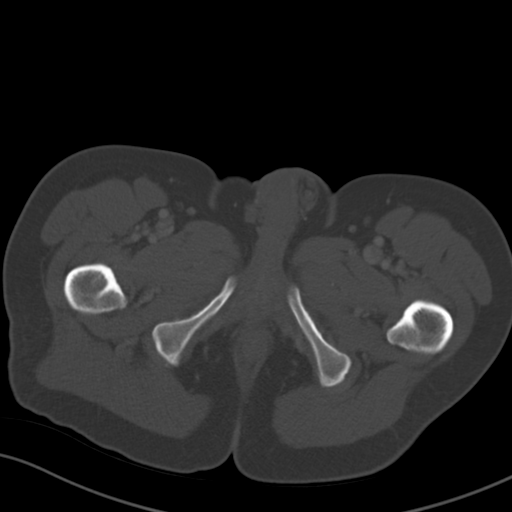
[im 11/84  soft-tissue]
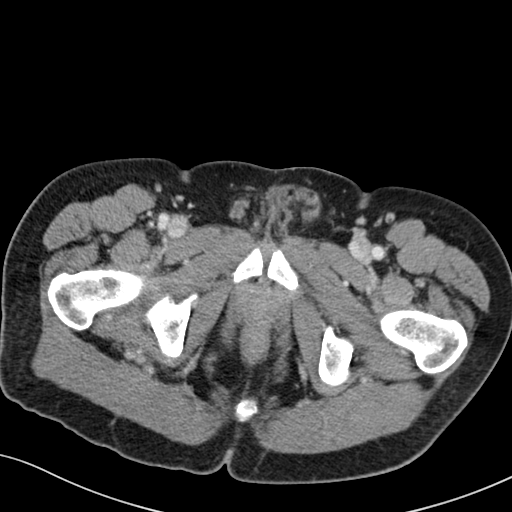
[im 16/84  soft-tissue]
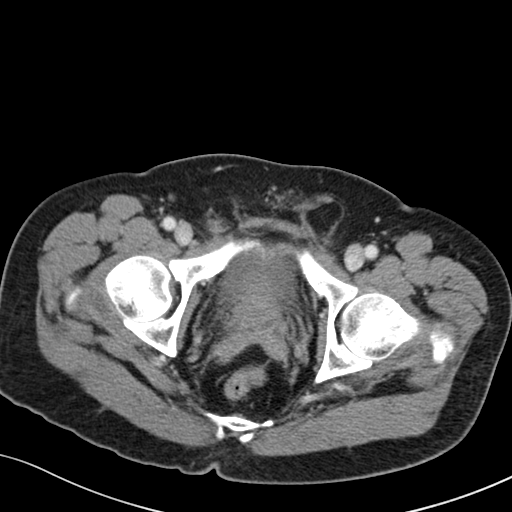
[im 26/84  soft-tissue]
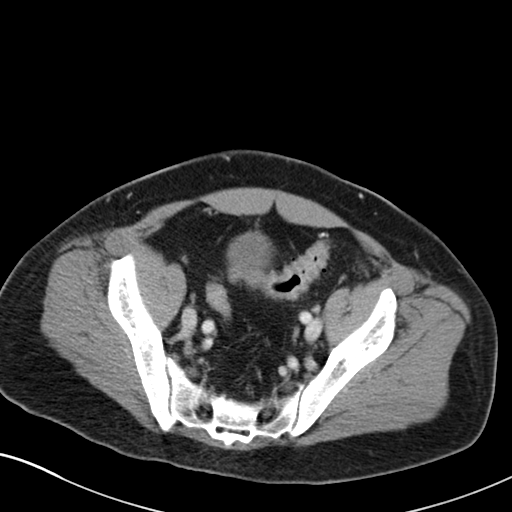
[im 32/84  soft-tissue]
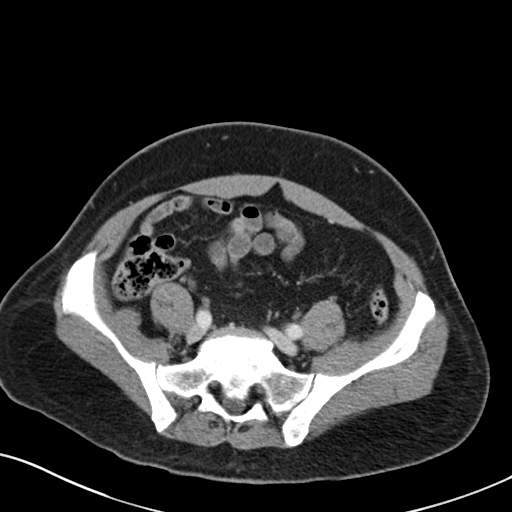
[im 37/84  soft-tissue]
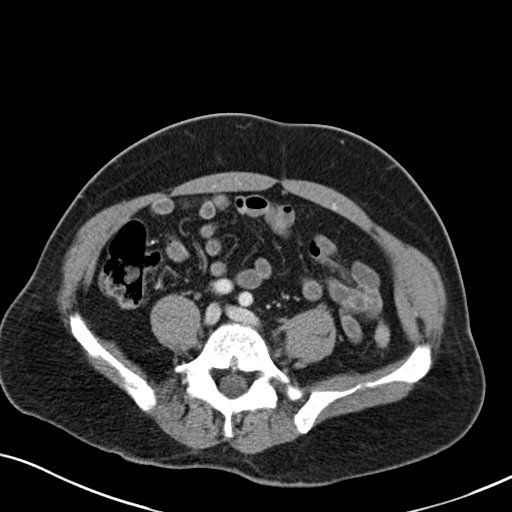
[im 42/84  soft-tissue]
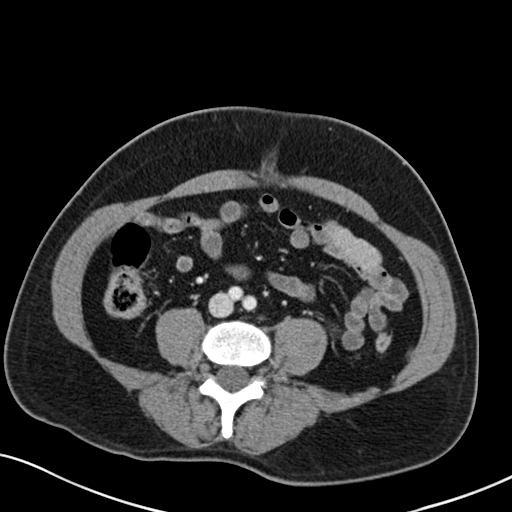
[im 47/84  soft-tissue]
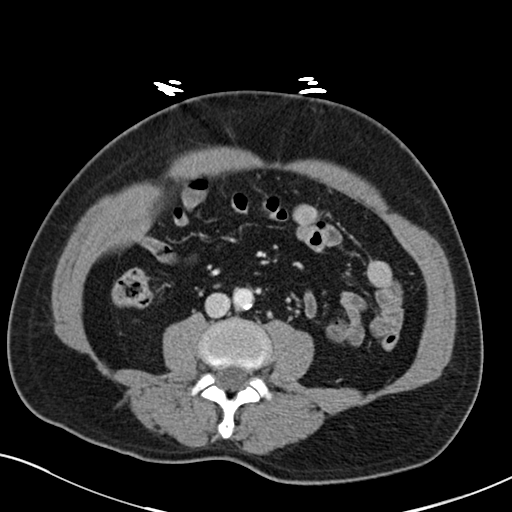
[im 52/84  soft-tissue]
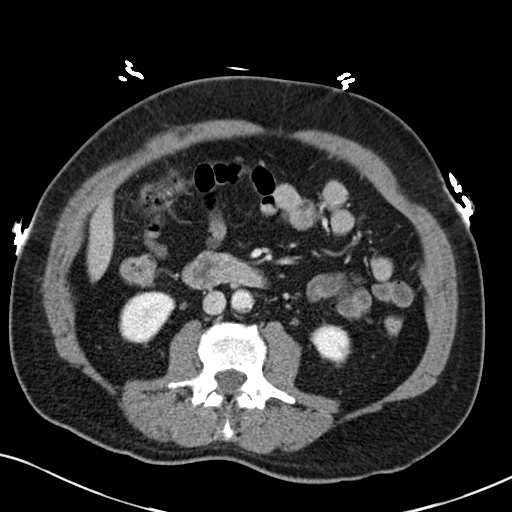
[im 52/84  bone]
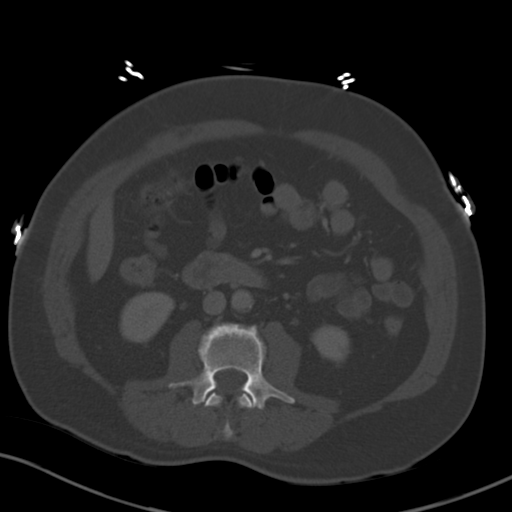
[im 58/84  soft-tissue]
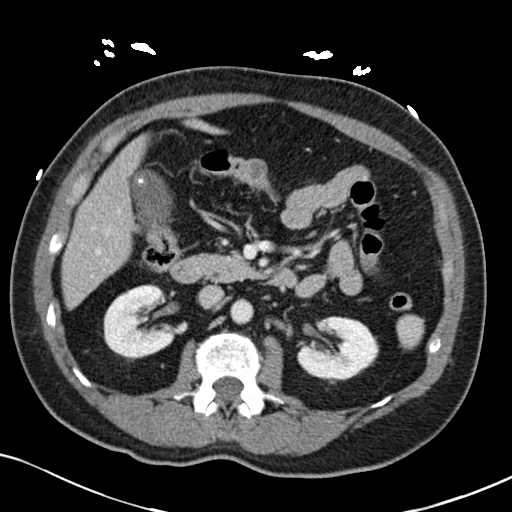
[im 68/84  soft-tissue]
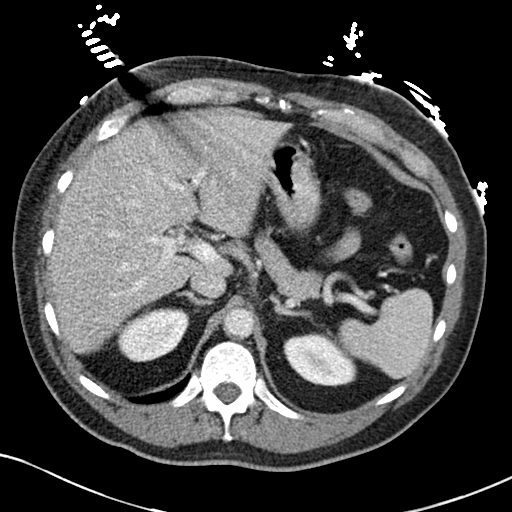
[im 73/84  soft-tissue]
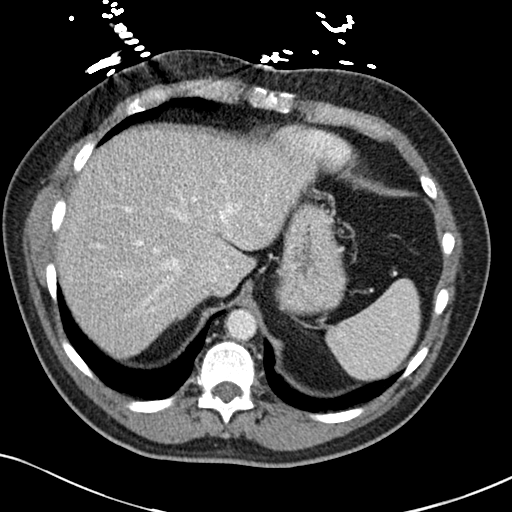
[im 78/84  soft-tissue]
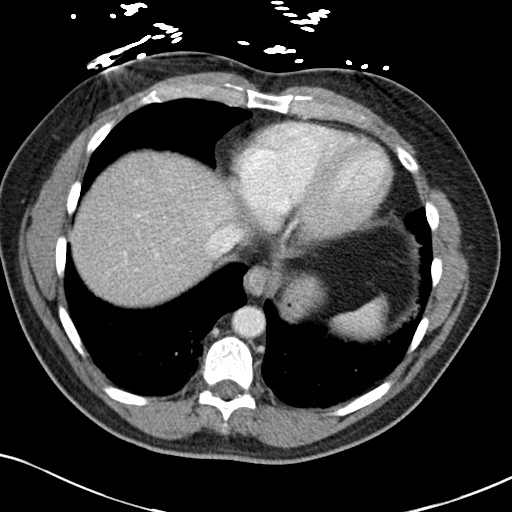

[Series 5: coronal st · coronal · 0.62mm/px · 3 of 136 slices shown]
[im 46/136  soft-tissue]
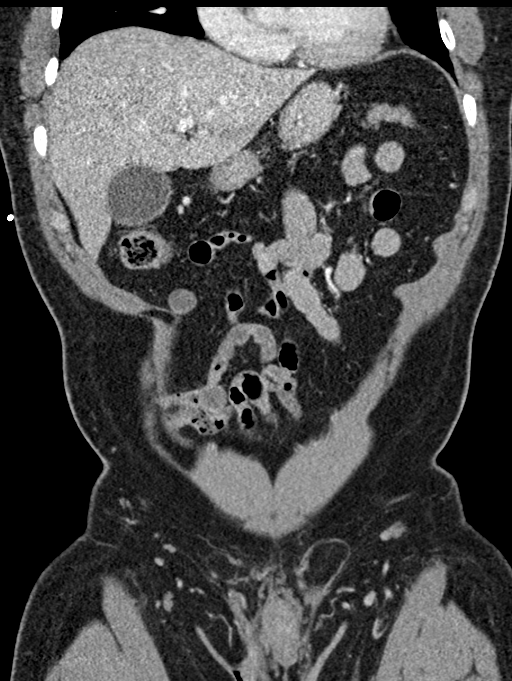
[im 61/136  soft-tissue]
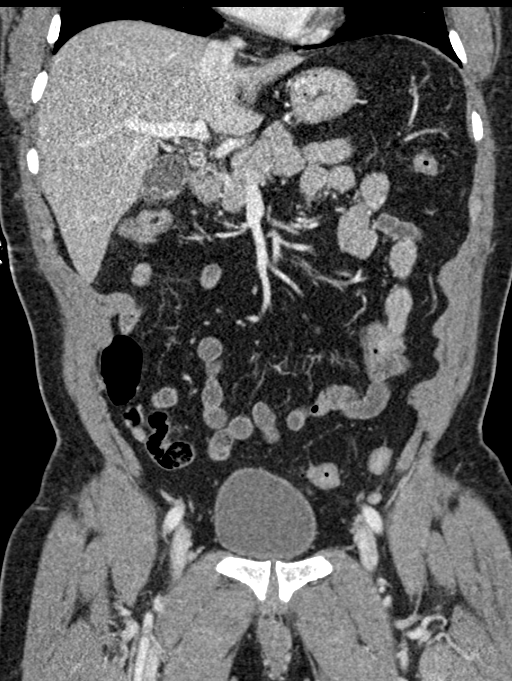
[im 76/136  soft-tissue]
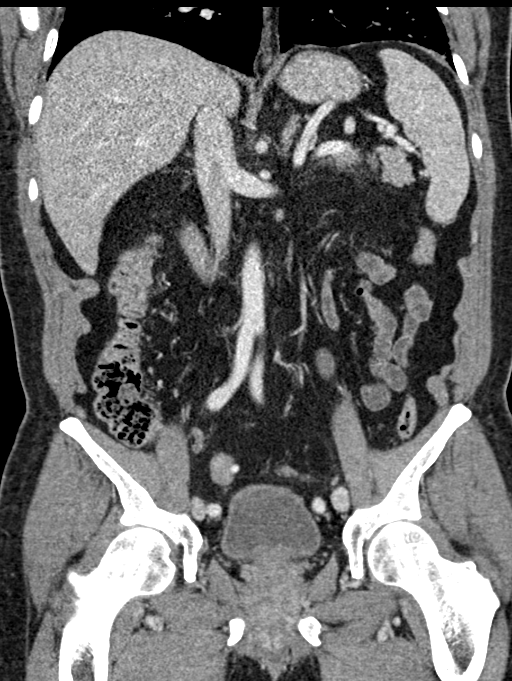

[16 of 46 positions shown; findings below may reference images not displayed]

FINDINGS: Lower chest: No acute abnormality.

Hepatobiliary: No focal liver abnormality is seen. Cholelithiasis
including a stone in the gallbladder neck. No gallbladder wall
thickening or biliary dilatation.

Pancreas: Unremarkable. No pancreatic ductal dilatation or
surrounding inflammatory changes.

Spleen: Normal in size without focal abnormality.

Adrenals/Urinary Tract: Adrenal glands are unremarkable. Kidneys are
normal, without renal calculi, focal lesion, or hydronephrosis.
Bladder is unremarkable.

Stomach/Bowel: Stomach is within normal limits. Appendix appears
normal. No evidence of bowel wall thickening, distention, or
inflammatory changes. Mild sigmoid diverticulosis.

Vascular/Lymphatic: Aortic atherosclerosis. No enlarged abdominal or
pelvic lymph nodes.

Reproductive: Mild prostate enlargement and central calcifications.

Other: Small bilateral inguinal hernias containing fat.

Musculoskeletal: No fracture is seen. Lumbar spondylosis greatest at
L5-S1 with there is moderate loss of intervertebral disc space
height.
IMPRESSION: 1. No acute process identified as explanation for pain.
2. Cholelithiasis.
3. Mild sigmoid diverticulosis.
4. Aortic Atherosclerosis ([id number]).

## 2020-07-24 IMAGING — DX PORTABLE CHEST - 1 VIEW
1 series · 1 of 1 positions shown · non-contrast
Comparison: 12/04/2017

CLINICAL DATA: Chest pain. Nausea.

EXAM:
PORTABLE CHEST 1 VIEW

[chest ap]
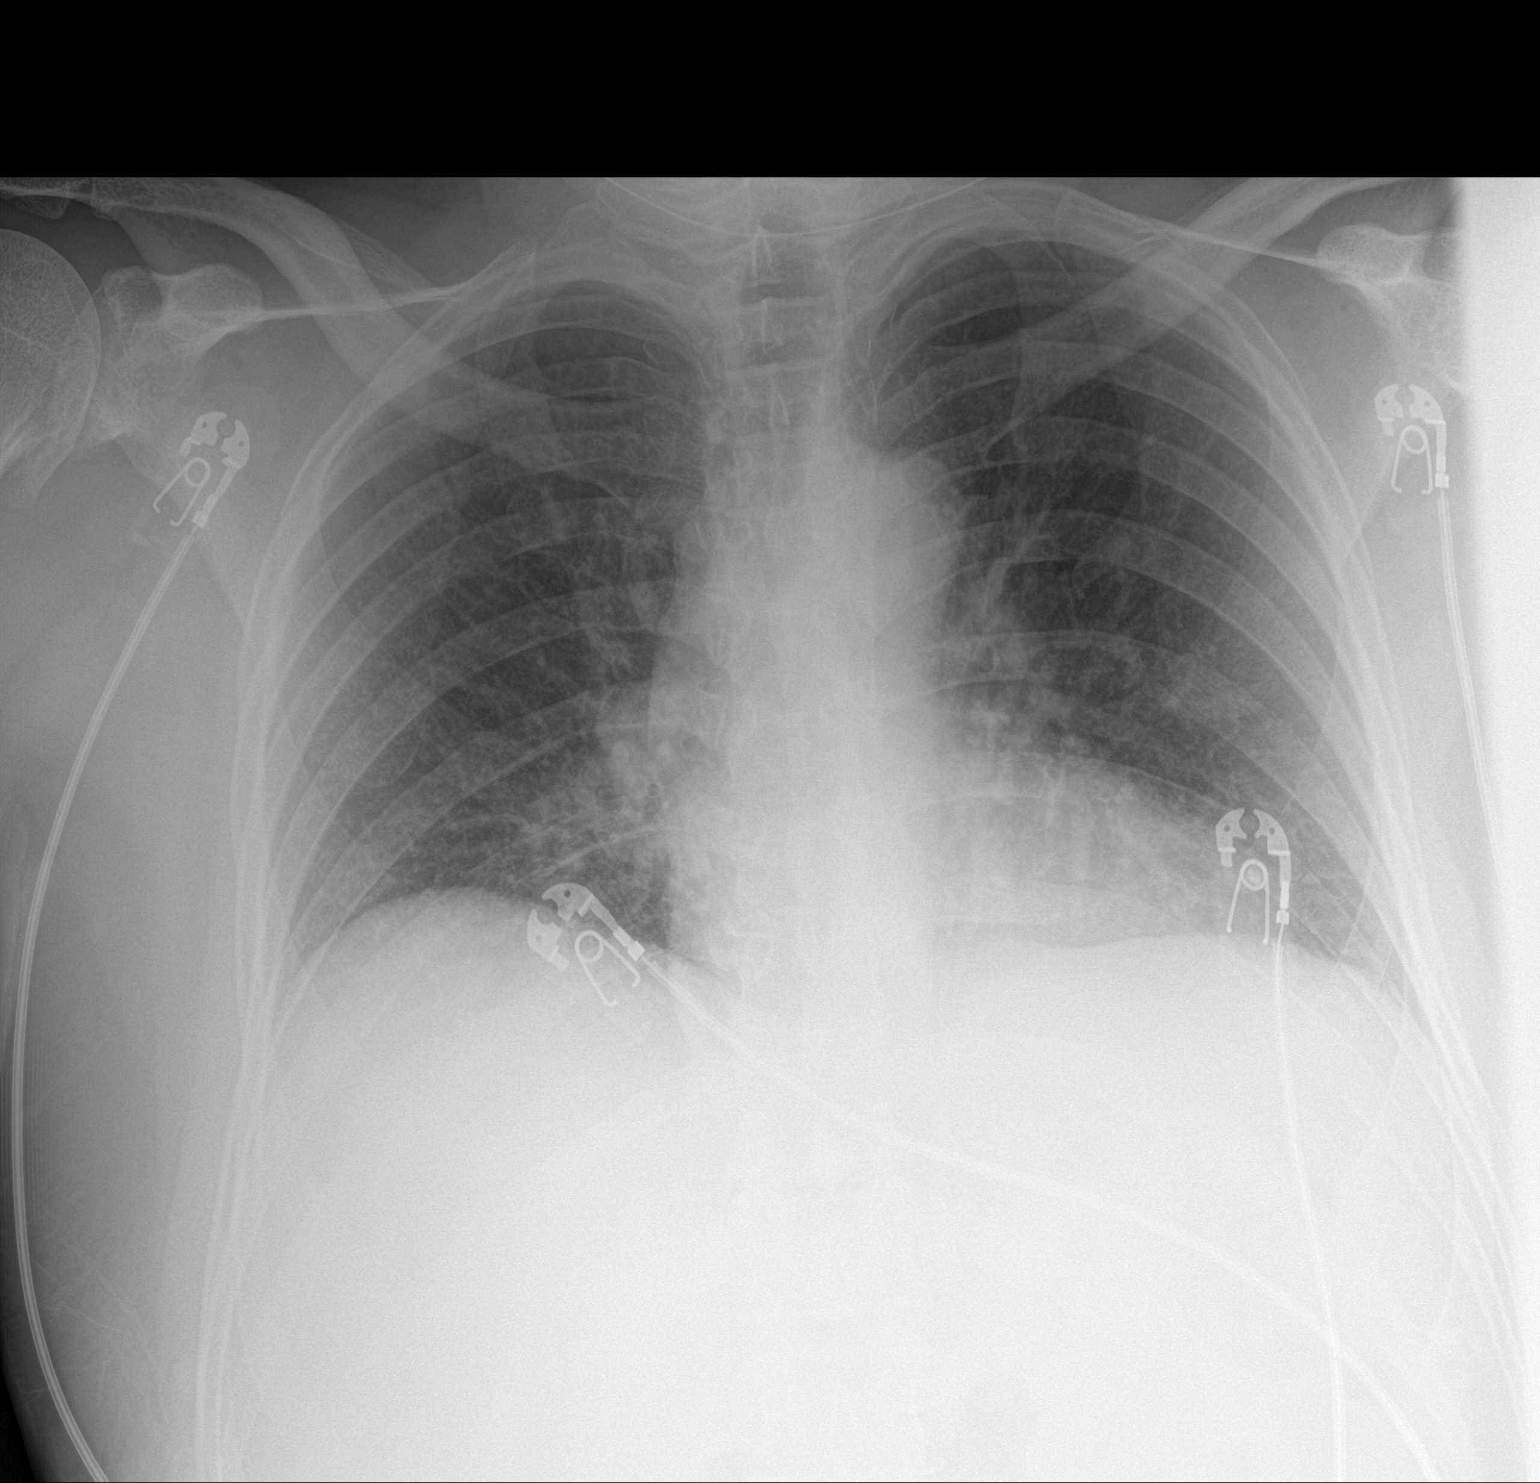

[1 of 1 positions shown; findings below may reference images not displayed]

FINDINGS: Low lung volumes. Bronchial thickening again seen, likely not
significantly changed. Normal heart size and mediastinal contours
for low lung volumes. No confluent airspace disease, pleural
effusion or pneumothorax. Degenerative change of the right shoulder.
IMPRESSION: Low lung volumes. Bronchial thickening is unchanged from prior exam.
